# Patient Record
Sex: Female | Born: 1975 | Race: Black or African American | Hispanic: No | Smoking: Never smoker
Health system: Southern US, Community
[De-identification: ages and names within clinical notes are randomized; demographics above are authoritative.]

## PROBLEM LIST (undated history)

## (undated) DIAGNOSIS — F419 Anxiety disorder, unspecified: Secondary | ICD-10-CM

---

## 2002-01-25 ENCOUNTER — Emergency Department (HOSPITAL_COMMUNITY): Admission: EM | Admit: 2002-01-25 | Discharge: 2002-01-25 | Payer: Self-pay | Admitting: *Deleted

## 2002-01-25 ENCOUNTER — Encounter: Payer: Self-pay | Admitting: *Deleted

## 2003-10-13 ENCOUNTER — Inpatient Hospital Stay (HOSPITAL_COMMUNITY): Admission: AD | Admit: 2003-10-13 | Discharge: 2003-10-13 | Payer: Self-pay | Admitting: Obstetrics and Gynecology

## 2003-10-20 ENCOUNTER — Ambulatory Visit (HOSPITAL_COMMUNITY): Admission: RE | Admit: 2003-10-20 | Discharge: 2003-10-20 | Payer: Self-pay | Admitting: Obstetrics and Gynecology

## 2003-12-05 ENCOUNTER — Inpatient Hospital Stay (HOSPITAL_COMMUNITY): Admission: AD | Admit: 2003-12-05 | Discharge: 2003-12-07 | Payer: Self-pay | Admitting: Obstetrics and Gynecology

## 2004-07-03 ENCOUNTER — Other Ambulatory Visit: Admission: RE | Admit: 2004-07-03 | Discharge: 2004-07-03 | Payer: Self-pay | Admitting: Obstetrics and Gynecology

## 2005-09-09 ENCOUNTER — Other Ambulatory Visit: Admission: RE | Admit: 2005-09-09 | Discharge: 2005-09-09 | Payer: Self-pay | Admitting: Obstetrics and Gynecology

## 2006-09-05 ENCOUNTER — Other Ambulatory Visit: Admission: RE | Admit: 2006-09-05 | Discharge: 2006-09-05 | Payer: Self-pay | Admitting: Family Medicine

## 2008-06-24 ENCOUNTER — Other Ambulatory Visit: Admission: RE | Admit: 2008-06-24 | Discharge: 2008-06-24 | Payer: Self-pay | Admitting: Family Medicine

## 2008-07-05 ENCOUNTER — Encounter: Admission: RE | Admit: 2008-07-05 | Discharge: 2008-07-05 | Payer: Self-pay | Admitting: Family Medicine

## 2010-02-03 IMAGING — MG MM DIAGNOSTIC BILATERAL
5 series · 5 of 5 positions shown · non-contrast
Comparison: No prior studies.

CLINICAL DATA: Nodularity inferiorly within the right breast and
also to a lesser extent within the inferior portion of the left
breast.

DIGITAL DIAGNOSTIC  BILATERAL BASELINE  MAMMOGRAM  WITH CAD AND
BILATERAL BREAST ULTRASOUND:

[R CC]
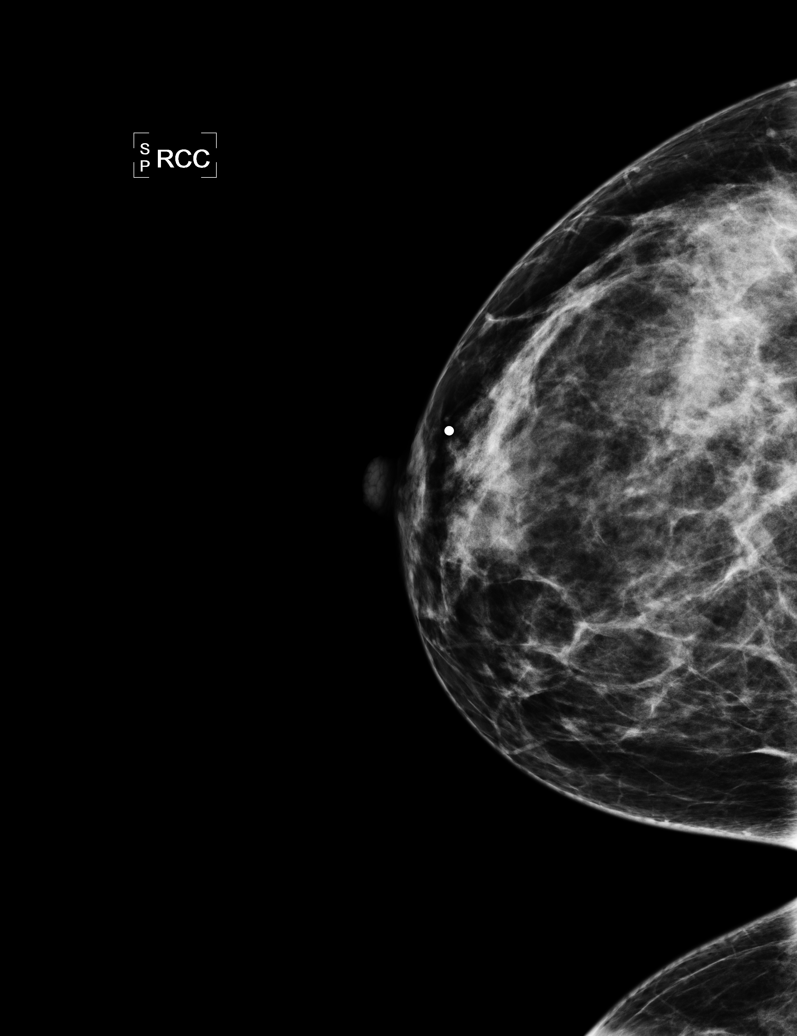

[L CC]
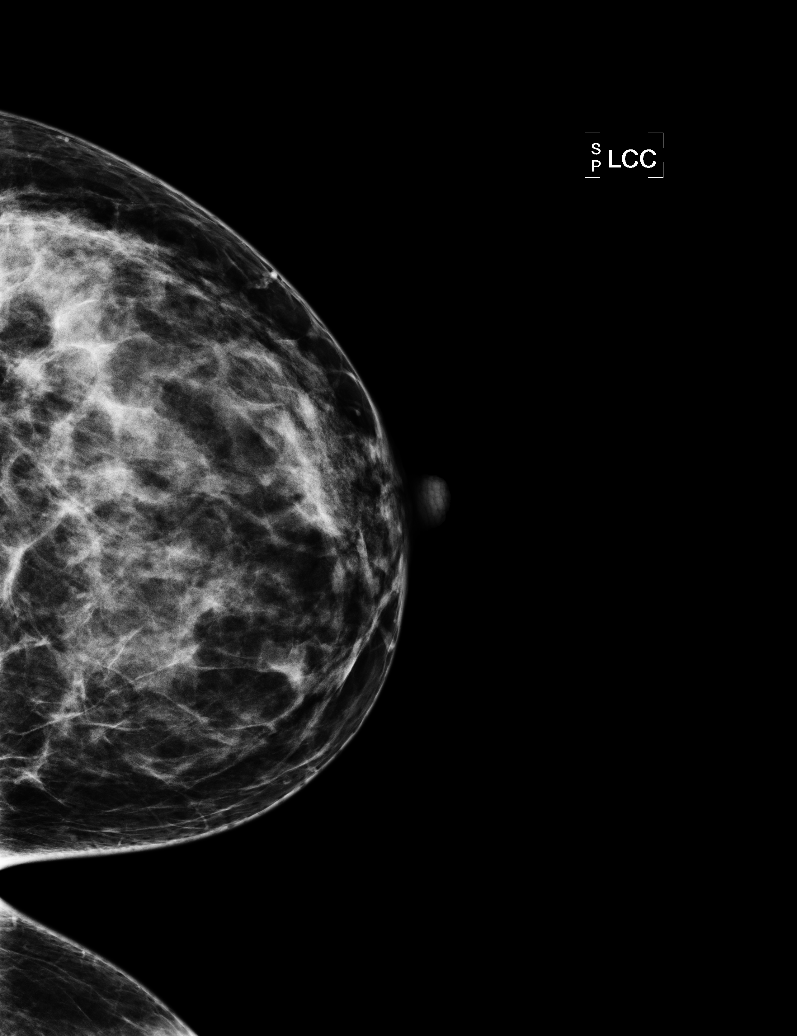

[L MLO]
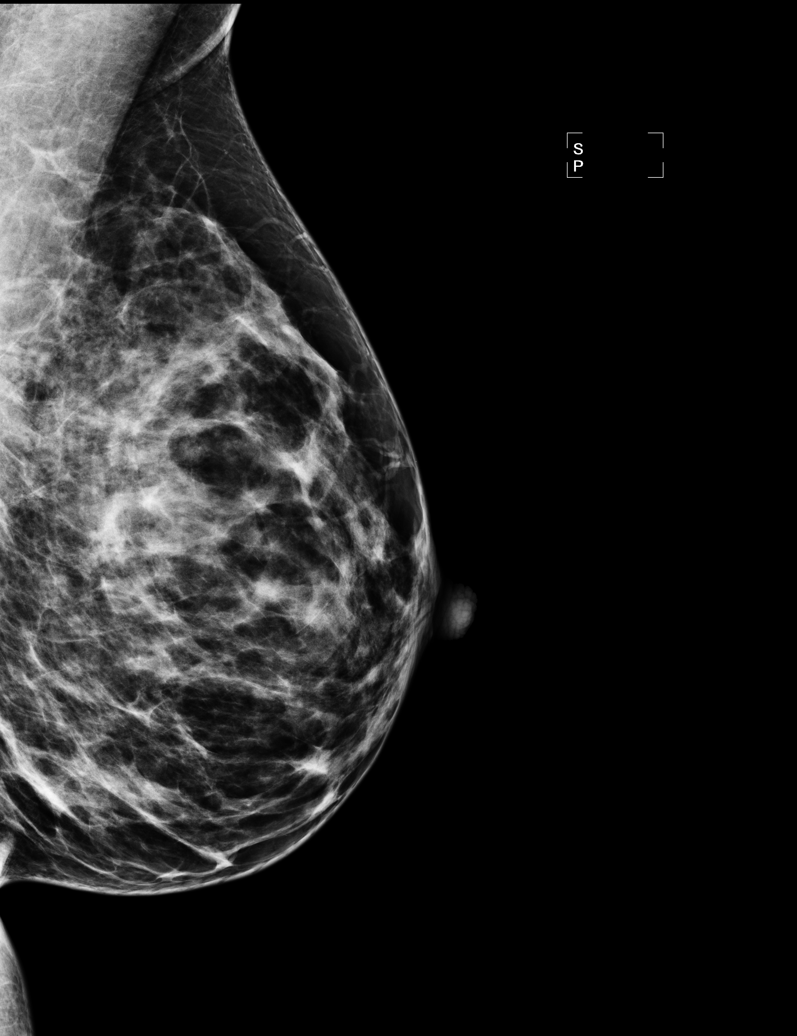

[R MLO]
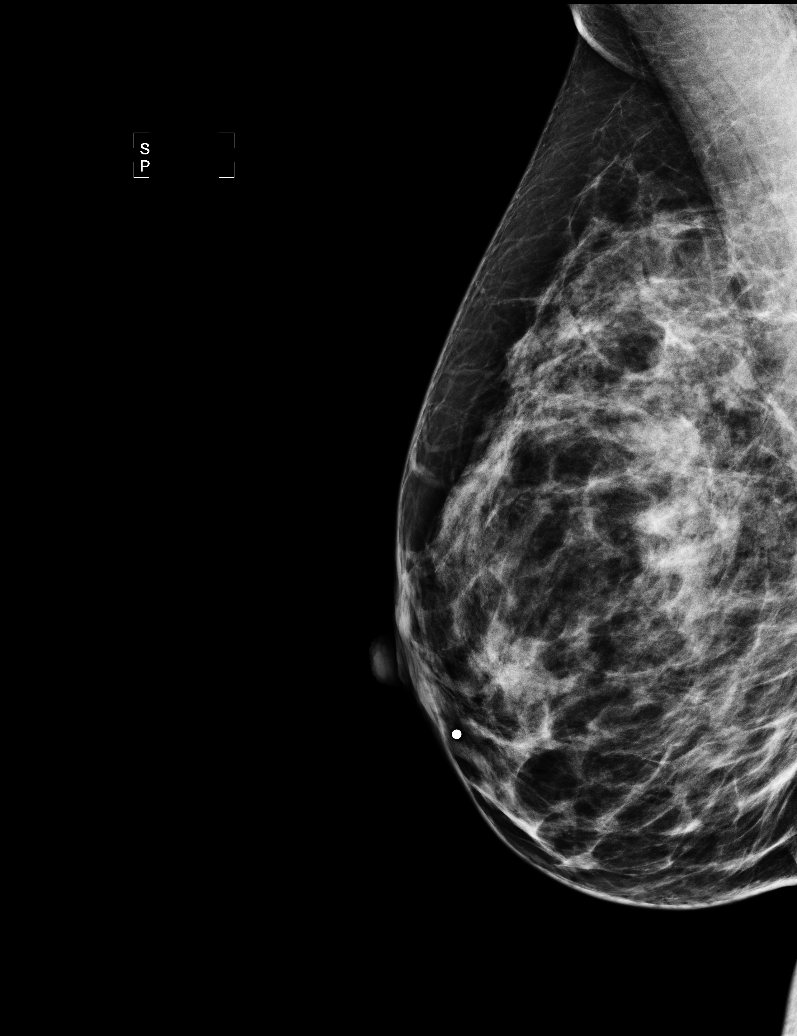

[R TAN]
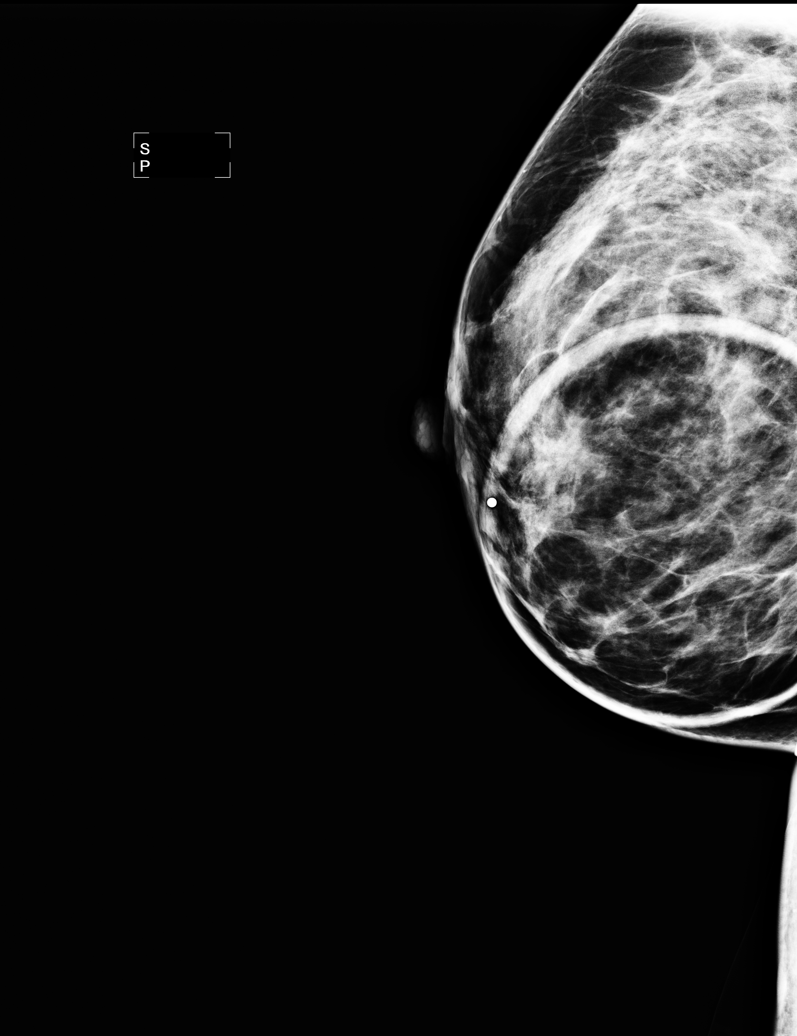

[5 of 5 positions shown; findings below may reference images not displayed]

FINDINGS: There is a moderately dense scattered fibroglandular
parenchymal pattern.  There is no evidence for mass.  There are no
areas of distortion or suspicious microcalcification.

On physical exam, there is mild nodularity within the inferior
portions of the breast without a discrete palpable mass present.

Ultrasound is performed, showing normal-appearing fibroglandular
tissue.  There is no mass, cyst, distortion, or worrisome
shadowing.
IMPRESSION: No findings worrisome for malignancy.  The importance of breast
self-examination was stressed with the patient.  Recommend annual
screening mammography at age 40 or earlier if felt to be clinically
indicated.

BI-RADS CATEGORY 1:  Negative.

## 2010-07-17 ENCOUNTER — Emergency Department (HOSPITAL_BASED_OUTPATIENT_CLINIC_OR_DEPARTMENT_OTHER): Admission: EM | Admit: 2010-07-17 | Discharge: 2010-07-17 | Payer: Self-pay | Admitting: Emergency Medicine

## 2010-07-25 ENCOUNTER — Other Ambulatory Visit
Admission: RE | Admit: 2010-07-25 | Discharge: 2010-07-25 | Payer: Self-pay | Source: Home / Self Care | Admitting: Family Medicine

## 2011-03-08 NOTE — H&P (Signed)
NAME:  CAMRY, ROBELLO                     ACCOUNT NO.:  [id number]   MEDICAL RECORD NO.:  [id number]                   PATIENT TYPE:  INP   LOCATION:  9164                                 FACILITY:  [facility]   PHYSICIAN:  Hal Morales, M.D.             DATE OF BIRTH:  11/15/1975   DATE OF ADMISSION:  12/05/2003  DATE OF DISCHARGE:                                HISTORY & PHYSICAL   HISTORY OF PRESENT ILLNESS:  Ms. Kearse is a 35 year old, gravida 4, para  2-0-1-2, at 36 and 2/7ths weeks, EDD November 26, 2003 by 15-week ultrasound,  and confirmed with follow up.  She presents with contractions throughout the  day, December 04, 2003, increasing in frequency and intensity.  She reports  positive fetal movement.  No bleeding, no range of motion.  No PIH symptoms.  No headache, visual changes, or epigastric pain.  Her pregnancy has been  followed by the M.D. service at [facility], and is remarkable for (1) late  prenatal care, (2) conceived on OCP's, (3) history of macrosomia, (4) kidney  stones and pyelonephritis at 24 weeks, (5) bilateral choroid plexus cysts,  resolved, (6) echogenic material in the amniotic fluid by ultrasound, (7)  group B strep negative.   OBSTETRICAL HISTORY:  In 1993, the patient had a normal spontaneous vaginal  delivery with the birth of a 9 pound, 12 ounce female infant named [place]  at [facility].  She was induced with no complications.  In the year  2000, she had a first trimester EAB with no complications.  In 2001, the  patient had a normal spontaneous vaginal delivery at 40 weeks, with the  birth of a 7 pounds, 11 ounce female infant named Pura Spice at [facility] with no complications, and the present pregnancy.   ALLERGIES:  The patient has no known drug allergies.   CURRENT MEDICATIONS:  She is not currently taking any medications, except  prenatal vitamins, and denies the use of tobacco, alcohol, or illicit drugs.   MEDICAL HISTORY:   She had an abnormal Pap smear, which was reevaluated and  found to be normal.  Otherwise, her medical history is unremarkable.   FAMILY HISTORY:  The patient's mother deceased of myocardial infarction at  age 91.  Paternal aunt with diabetes.  The patient's sister with a history  of thyroid disease.  Maternal first cousin with chronic renal disease.  The  patient's father with a brain tumor, deceased.  The patient's brother with  schizophrenia, and mother with depression.   GENETIC HISTORY:  The patient's first cousin with a history of sickle cell  trait, and father of the baby's niece with mental retardation.   SOCIAL HISTORY:  Ms. Cena is a 35 year old married African-American  female.  Her husband, Caleesi Kohl, is involved and supportive.  They  are [place] in their faith.   REVIEW OF SYSTEMS:  As described above.  The patient is typical of  one with  a uterine pregnancy at term.   PRENATAL HISTORY:  This patient entered prenatal care on June 09, 2003 at  15 weeks and 5 days by ultrasound.  This patient's prenatal course was  complicated by fetal exam by ultrasound showing bilateral choroid plexus  cysts.  These persisted to 24 weeks.  The patient was counseled and  scheduled for genetic counseling and ultrasound and possible amniocentesis  with a perinatologist at [facility].  Bilateral choroid cysts were on  ultrasound examination, and the patient declined amniocentesis.  On August 01, 2003, the patient was diagnosed with kidney stones and pyelonephritis.  She was hospitalized at [facility] with Dr. Delton See as her  urologist.  The patient was seen for evaluation and treatment for hip pain  at [facility].  One hour glucose challenge at 18 weeks and at 28 weeks within  normal limits.  Blood type O positive.  At 33 weeks, estimated fetal weight  50th to 75th percentile, and echogenic material noted in the amniotic fluid.  Negative KLB, and normal CBC.   The patient was followed from that point with  biweekly NST's, AFI, BPP, and cervical evaluations.  These have been within  normal limits.  At 36 weeks, the infant was noted to remain in breech  presentation.  External cephalic version was planned, but the baby  spontaneously verted to vertex presentation.  Fasting blood sugar at 38  weeks within normal limits.  The patient has been size greater than dates  throughout her pregnancy.  Estimated fetal weight remained at the 50th to  75th percentile.   PRENATAL LABORATORY WORK:  On July 01, 2003, hemoglobin and hematocrit  11.1 and 33.9, platelets 106,000.  Blood type and Rh O positive, antibody  screen negative, sickle cell trait negative, VDRL nonreactive, rubella  immune, hepatitis B surface antigen negative, HIV nonreactive.  Quad screen  within normal limits.  One hour glucose challenge at 18 and 28 weeks within  normal limits at 36 weeks.  Culture of the vaginal tract is negative for  group B strep, GC, and Chlamydia.   The patient's antenatal testing has all been within normal limits, and she  has been scheduled for induction of labor on December 07, 2003; however, in  light of nonreactive fetal heart rate and post-dates, the patient is  admitted at the present time, with plan to be discussed with the patient by  Dr. Dierdre Forth.   ASSESSMENT:  1. Intrauterine pregnancy at 55 and 2/7ths weeks.  2. Post-dates nonreactive fetal heart rate.   PLAN:  1. Admit per Dr. Dierdre Forth.  2. Routine M.D. orders.     Rica Koyanagi, C.N.M.               Hal Morales, M.D.    SDM/MEDQ  D:  12/05/2003  T:  12/05/2003  Job:  [id number]

## 2017-03-20 ENCOUNTER — Ambulatory Visit: Payer: Self-pay | Admitting: Physician Assistant

## 2018-01-21 ENCOUNTER — Encounter: Payer: Self-pay | Admitting: Physician Assistant

## 2019-05-13 ENCOUNTER — Encounter (HOSPITAL_BASED_OUTPATIENT_CLINIC_OR_DEPARTMENT_OTHER): Payer: Self-pay | Admitting: *Deleted

## 2019-05-13 ENCOUNTER — Other Ambulatory Visit: Payer: Self-pay

## 2019-05-13 ENCOUNTER — Emergency Department (HOSPITAL_BASED_OUTPATIENT_CLINIC_OR_DEPARTMENT_OTHER)
Admission: EM | Admit: 2019-05-13 | Discharge: 2019-05-14 | Disposition: A | Discharge status: Home / Self Care | Payer: BLUE CROSS/BLUE SHIELD | Attending: Family Medicine | Admitting: Family Medicine

## 2019-05-13 DIAGNOSIS — E876 Hypokalemia: Secondary | ICD-10-CM | POA: Diagnosis not present

## 2019-05-13 DIAGNOSIS — R000 Tachycardia, unspecified: Secondary | ICD-10-CM

## 2019-05-13 DIAGNOSIS — T887XXA Unspecified adverse effect of drug or medicament, initial encounter: Secondary | ICD-10-CM | POA: Diagnosis not present

## 2019-05-13 DIAGNOSIS — T50905A Adverse effect of unspecified drugs, medicaments and biological substances, initial encounter: Secondary | ICD-10-CM | POA: Diagnosis not present

## 2019-05-13 DIAGNOSIS — R002 Palpitations: Secondary | ICD-10-CM | POA: Diagnosis present

## 2019-05-13 DIAGNOSIS — Y69 Unspecified misadventure during surgical and medical care: Secondary | ICD-10-CM | POA: Diagnosis not present

## 2019-05-13 HISTORY — DX: Anxiety disorder, unspecified: F41.9

## 2019-05-13 LAB — CBC WITH DIFFERENTIAL/PLATELET
Abs Immature Granulocytes: 0.01 10*3/uL (ref 0.00–0.07)
Basophils Absolute: 0.1 10*3/uL (ref 0.0–0.1)
Basophils Relative: 1 %
Eosinophils Absolute: 0.1 10*3/uL (ref 0.0–0.5)
Eosinophils Relative: 1 %
HCT: 42.7 % (ref 36.0–46.0)
Hemoglobin: 13.6 g/dL (ref 12.0–15.0)
Immature Granulocytes: 0 %
Lymphocytes Relative: 57 %
Lymphs Abs: 4.7 10*3/uL — ABNORMAL HIGH (ref 0.7–4.0)
MCH: 28.7 pg (ref 26.0–34.0)
MCHC: 31.9 g/dL (ref 30.0–36.0)
MCV: 90.1 fL (ref 80.0–100.0)
Monocytes Absolute: 1 10*3/uL (ref 0.1–1.0)
Monocytes Relative: 12 %
Neutro Abs: 2.3 10*3/uL (ref 1.7–7.7)
Neutrophils Relative %: 29 %
Platelets: 258 10*3/uL (ref 150–400)
RBC: 4.74 MIL/uL (ref 3.87–5.11)
RDW: 13.6 % (ref 11.5–15.5)
WBC: 8.1 10*3/uL (ref 4.0–10.5)
nRBC: 0 % (ref 0.0–0.2)

## 2019-05-13 LAB — COMPREHENSIVE METABOLIC PANEL
ALT: 27 U/L (ref 0–44)
AST: 27 U/L (ref 15–41)
Albumin: 3.5 g/dL (ref 3.5–5.0)
Alkaline Phosphatase: 77 U/L (ref 38–126)
Anion gap: 13 (ref 5–15)
BUN: 14 mg/dL (ref 6–20)
CO2: 18 mmol/L — ABNORMAL LOW (ref 22–32)
Calcium: 8.8 mg/dL — ABNORMAL LOW (ref 8.9–10.3)
Chloride: 105 mmol/L (ref 98–111)
Creatinine, Ser: 0.81 mg/dL (ref 0.44–1.00)
GFR calc Af Amer: >60 mL/min (ref >60)
GFR calc non Af Amer: >60 mL/min (ref >60)
Glucose, Bld: 144 mg/dL — ABNORMAL HIGH (ref 70–99)
Potassium: 2.7 mmol/L — CL (ref 3.5–5.1)
Sodium: 136 mmol/L (ref 135–145)
Total Bilirubin: 0.3 mg/dL (ref 0.3–1.2)
Total Protein: 7.2 g/dL (ref 6.5–8.1)

## 2019-05-13 LAB — MAGNESIUM: Magnesium: 2 mg/dL (ref 1.7–2.4)

## 2019-05-13 LAB — LIPASE, BLOOD: Lipase: 35 U/L (ref 11–51)

## 2019-05-13 MED ORDER — ADENOSINE 6 MG/2ML IV SOLN
18.0000 mg | Freq: Once | INTRAVENOUS | Status: AC
  Administered 2019-05-13: 18 mg via INTRAVENOUS

## 2019-05-13 MED ORDER — POTASSIUM CHLORIDE 10 MEQ/100ML IV SOLN
10.0000 meq | INTRAVENOUS | Status: AC
  Administered 2019-05-13 – 2019-05-14 (×4): 10 meq via INTRAVENOUS
  Filled 2019-05-13 (×4): qty 100

## 2019-05-13 MED ORDER — ADENOSINE 6 MG/2ML IV SOLN
INTRAVENOUS | Status: AC
  Filled 2019-05-13: qty 6

## 2019-05-13 MED ORDER — ADENOSINE 6 MG/2ML IV SOLN
12.0000 mg | Freq: Once | INTRAVENOUS | Status: AC
  Administered 2019-05-13: 12 mg via INTRAVENOUS

## 2019-05-13 MED ORDER — ADENOSINE 6 MG/2ML IV SOLN
6.0000 mg | Freq: Once | INTRAVENOUS | Status: AC
  Administered 2019-05-13: 22:00:00 6 mg via INTRAVENOUS

## 2019-05-13 MED ORDER — SODIUM CHLORIDE 0.9 % IV BOLUS
500.0000 mL | Freq: Once | INTRAVENOUS | Status: AC
  Administered 2019-05-13: 500 mL via INTRAVENOUS

## 2019-05-13 MED ORDER — SODIUM CHLORIDE 0.9 % IV BOLUS
1000.0000 mL | Freq: Once | INTRAVENOUS | Status: AC
  Administered 2019-05-13: 1000 mL via INTRAVENOUS

## 2019-05-13 MED ORDER — LORAZEPAM 2 MG/ML IJ SOLN
1.0000 mg | Freq: Once | INTRAMUSCULAR | Status: AC
Start: 2019-05-13 — End: 2019-05-13
  Administered 2019-05-13: 22:00:00 1 mg via INTRAVENOUS
  Filled 2019-05-13: qty 1

## 2019-05-13 NOTE — ED Triage Notes (Signed)
States she took some CBD chewables 40 minutes ago and now her heart is racing. States she takes Wellbutrin.

## 2019-05-13 NOTE — ED Notes (Signed)
Date and time results received: 05/13/19 2216 (use smartphrase ".now" to insert current time)  Test: K+ Critical Value: 2.7  Name of Provider Notified:Long

## 2019-05-13 NOTE — ED Notes (Signed)
Pt on monitor 

## 2019-05-13 NOTE — ED Provider Notes (Signed)
Emergency Department Provider Note   I have reviewed the triage vital signs and the nursing notes.   HISTORY  Chief Complaint Palpitations and Ingestion CBD product   HPI Taylor Houston is a 43 y.o. female with past medical history of anxiety presents to the emergency department for evaluation of heart palpitations.  She states that she took some CBD chewables and shortly afterwards developed the palpitations.  She denies any chest pain or shortness of breath.  No fevers or chills.  No similar symptoms in the past.  She denies history of thyroid issues.  No lightheadedness or near syncope.   Past Medical History:  Diagnosis Date  . Anxiety     There are no active problems to display for this patient.   History reviewed. No pertinent surgical history.  Allergies Patient has no known allergies.  No family history on file.  Social History Social History   Tobacco Use  . Smoking status: Never Smoker  . Smokeless tobacco: Never Used  Substance Use Topics  . Alcohol use: Yes  . Drug use: Never    Review of Systems  Constitutional: No fever/chills Eyes: No visual changes. ENT: No sore throat. Cardiovascular: Denies chest pain. Positive heart palpitations.  Respiratory: Denies shortness of breath. Gastrointestinal: No abdominal pain.  No nausea, no vomiting.  No diarrhea.  No constipation. Genitourinary: Negative for dysuria. Musculoskeletal: Negative for back pain. Skin: Negative for rash. Neurological: Negative for headaches, focal weakness or numbness.  10-point ROS otherwise negative.  ____________________________________________   PHYSICAL EXAM:  VITAL SIGNS: ED Triage Vitals  Enc Vitals Group     BP 05/13/19 2121 (!) 178/115     Pulse Rate 05/13/19 2121 (!) 168     Resp 05/13/19 2121 20     Temp 05/13/19 2121 98.6 F (37 C)     Temp Source 05/13/19 2121 Oral     SpO2 05/13/19 2121 100 %     Weight 05/13/19 2119 184 lb (83.5 kg)     Height  05/13/19 2119 5\' 7"  (1.702 m)   Constitutional: Alert and oriented. Well appearing and in no acute distress. Eyes: Conjunctivae are normal.  Head: Atraumatic. Nose: No congestion/rhinnorhea. Mouth/Throat: Mucous membranes are moist.  Oropharynx non-erythematous. Neck: No stridor.   Cardiovascular: Tachycardia. Good peripheral circulation. Grossly normal heart sounds.   Respiratory: Normal respiratory effort.  No retractions. Lungs CTAB. Gastrointestinal: Soft and nontender. No distention.  Musculoskeletal: No lower extremity tenderness nor edema. No gross deformities of extremities. Neurologic:  Normal speech and language. No gross focal neurologic deficits are appreciated.  Skin:  Skin is warm, dry and intact. No rash noted.  ____________________________________________   LABS (all labs ordered are listed, but only abnormal results are displayed)  Labs Reviewed  COMPREHENSIVE METABOLIC PANEL - Abnormal; Notable for the following components:      Result Value   Potassium 2.7 (*)    CO2 18 (*)    Glucose, Bld 144 (*)    Calcium 8.8 (*)    All other components within normal limits  CBC WITH DIFFERENTIAL/PLATELET - Abnormal; Notable for the following components:   Lymphs Abs 4.7 (*)    All other components within normal limits  LIPASE, BLOOD  MAGNESIUM  D-DIMER, QUANTITATIVE (NOT AT [facility])   ____________________________________________  EKG   EKG Interpretation  Date/Time:  Thursday May 13 2019 21:49:37 EDT Ventricular Rate:  136 PR Interval:    QRS Duration: 82 QT Interval:  285 QTC Calculation: 429 R Axis:  71 Text Interpretation:  Sinus tachycardia Probable LVH with secondary repol abnrm ST depr, consider ischemia, inferior leads No STEMI  Confirmed by Alona BeneLong, Joshua [phone]([id number]) on 05/13/2019 10:26:28 PM Also confirmed by Alona BeneLong, Joshua [phone]), editor Barbette Hairassel, Kerry [phone])  on 05/14/2019 7:17:46 AM       ____________________________________________  RADIOLOGY  None  ____________________________________________   PROCEDURES  Procedure(s) performed:   Procedures  CRITICAL CARE Performed by: Maia PlanJoshua G Long Total critical care time: 35 minutes Critical care time was exclusive of separately billable procedures and treating other patients. Critical care was necessary to treat or prevent imminent or life-threatening deterioration. Critical care was time spent personally by me on the following activities: development of treatment plan with patient and/or surrogate as well as nursing, discussions with consultants, evaluation of patient's response to treatment, examination of patient, obtaining history from patient or surrogate, ordering and performing treatments and interventions, ordering and review of laboratory studies, ordering and review of radiographic studies, pulse oximetry and re-evaluation of patient's condition.  Alona BeneJoshua Long, MD Emergency Medicine  ____________________________________________   INITIAL IMPRESSION / ASSESSMENT AND PLAN / ED COURSE  Pertinent labs & imaging results that were available during my care of the patient were reviewed by me and considered in my medical decision making (see chart for details).   Patient presents to the emergency dept for evaluation of heart palpitations.  Symptoms began abruptly.  On arrival her EKG is most consistent with SVT versus one-to-one flutter.  Patient is hypertensive with normal oxygen sat.  She received 3 rounds of escalating adenosine with no change in rhythm although did slow somewhat.  No pause was identified on monitor.  Repeat EKG shows  sinus tachycardia now slowed to a rate in the 130s.  Labs show hypokalemia.  Which will be replaced.  Giving IV fluids, Ativan and following d-dimer.  Labs show hypokalemia. D-dimer pending. Patient given IVF, ativan, and potassium in the ED. HR continuing to normalize. Patient care transferred to Dr. Read DriversMolpus pending potassium completion, d-dimer, and  reassess. Suspect SVT related to drug ingestion.  ____________________________________________  FINAL CLINICAL IMPRESSION(S) / ED DIAGNOSES  Final diagnoses:  Adverse drug effect, initial encounter  Sinus tachycardia  Hypokalemia     MEDICATIONS GIVEN DURING THIS VISIT:  Medications  adenosine (ADENOCARD) 6 MG/2ML injection 6 mg (6 mg Intravenous Given 05/13/19 2139)  sodium chloride 0.9 % bolus 500 mL (0 mLs Intravenous Stopped 05/13/19 2158)  adenosine (ADENOCARD) 6 MG/2ML injection 12 mg (12 mg Intravenous Given 05/13/19 2142)  adenosine (ADENOCARD) 6 MG/2ML injection 18 mg (18 mg Intravenous Given 05/13/19 2146)  sodium chloride 0.9 % bolus 1,000 mL (0 mLs Intravenous Stopped 05/13/19 2306)  LORazepam (ATIVAN) injection 1 mg (1 mg Intravenous Given 05/13/19 2203)  potassium chloride 10 mEq in 100 mL IVPB (0 mEq Intravenous Stopped 05/14/19 0247)  LORazepam (ATIVAN) injection 1 mg (1 mg Intravenous Given 05/14/19 0239)    Note:  This document was prepared using Dragon voice recognition software and may include unintentional dictation errors.  Alona BeneJoshua Long, MD Emergency Medicine    Long, Arlyss RepressJoshua G, MD 05/14/19 [phone]

## 2019-05-14 LAB — D-DIMER, QUANTITATIVE (NOT AT ARMC): D-Dimer, Quant: 0.32 ug/mL-FEU (ref 0.00–0.50)

## 2019-05-14 MED ORDER — LORAZEPAM 2 MG/ML IJ SOLN
1.0000 mg | Freq: Once | INTRAMUSCULAR | Status: AC
  Administered 2019-05-14: 1 mg via INTRAVENOUS
  Filled 2019-05-14: qty 1

## 2019-05-14 MED ORDER — POTASSIUM CHLORIDE CRYS ER 20 MEQ PO TBCR: 20.0000 meq | EXTENDED_RELEASE_TABLET | Freq: Two times a day (BID) | ORAL | 0 refills | Status: AC

## 2019-05-14 NOTE — ED Provider Notes (Signed)
Nursing notes and vitals signs, including pulse oximetry, reviewed.  Summary of this visit's results, reviewed by myself:  EKG:  EKG Interpretation  Date/Time:  Thursday May 13 2019 21:49:37 EDT Ventricular Rate:  136 PR Interval:    QRS Duration: 82 QT Interval:  285 QTC Calculation: 429 R Axis:   71 Text Interpretation:  Sinus tachycardia Probable LVH with secondary repol abnrm ST depr, consider ischemia, inferior leads No STEMI  Confirmed by Alona BeneLong, Joshua [phone]) on 05/13/2019 10:26:28 PM       Labs:  Results for orders placed or performed during the hospital encounter of 05/13/19 (from the past 24 hour(s))  Comprehensive metabolic panel     Status: Abnormal   Collection Time: 05/13/19  9:37 PM  Result Value Ref Range   Sodium 136 135 - 145 mmol/L   Potassium 2.7 (LL) 3.5 - 5.1 mmol/L   Chloride 105 98 - 111 mmol/L   CO2 18 (L) 22 - 32 mmol/L   Glucose, Bld 144 (H) 70 - 99 mg/dL   BUN 14 6 - 20 mg/dL   Creatinine, Ser 6.040.81 0.44 - 1.00 mg/dL   Calcium 8.8 (L) 8.9 - 10.3 mg/dL   Total Protein 7.2 6.5 - 8.1 g/dL   Albumin 3.5 3.5 - 5.0 g/dL   AST 27 15 - 41 U/L   ALT 27 0 - 44 U/L   Alkaline Phosphatase 77 38 - 126 U/L   Total Bilirubin 0.3 0.3 - 1.2 mg/dL   GFR calc non Af Amer >60 >60 mL/min   GFR calc Af Amer >60 >60 mL/min   Anion gap 13 5 - 15  Lipase, blood     Status: None   Collection Time: 05/13/19  9:37 PM  Result Value Ref Range   Lipase 35 11 - 51 U/L  CBC with Differential     Status: Abnormal   Collection Time: 05/13/19  9:37 PM  Result Value Ref Range   WBC 8.1 4.0 - 10.5 K/uL   RBC 4.74 3.87 - 5.11 MIL/uL   Hemoglobin 13.6 12.0 - 15.0 g/dL   HCT 54.042.7 98.136.0 - 19.146.0 %   MCV 90.1 80.0 - 100.0 fL   MCH 28.7 26.0 - 34.0 pg   MCHC 31.9 30.0 - 36.0 g/dL   RDW 47.813.6 29.511.5 - 62.115.5 %   Platelets 258 150 - 400 K/uL   nRBC 0.0 0.0 - 0.2 %   Neutrophils Relative % 29 %   Neutro Abs 2.3 1.7 - 7.7 K/uL   Lymphocytes Relative 57 %   Lymphs Abs 4.7 (H) 0.7 - 4.0  K/uL   Monocytes Relative 12 %   Monocytes Absolute 1.0 0.1 - 1.0 K/uL   Eosinophils Relative 1 %   Eosinophils Absolute 0.1 0.0 - 0.5 K/uL   Basophils Relative 1 %   Basophils Absolute 0.1 0.0 - 0.1 K/uL   Immature Granulocytes 0 %   Abs Immature Granulocytes 0.01 0.00 - 0.07 K/uL  Magnesium     Status: None   Collection Time: 05/13/19  9:37 PM  Result Value Ref Range   Magnesium 2.0 1.7 - 2.4 mg/dL  D-dimer, quantitative     Status: None   Collection Time: 05/13/19  9:38 PM  Result Value Ref Range   D-Dimer, Quant 0.32 0.00 - 0.50 ug/mL-FEU   3:28 AM Patient given 4 rounds of potassium chloride in ED.  We will discharge her on oral potassium supplementation and have her follow-up with her primary care physician.  The cause of her persistent sinus tachycardia is likely due to adverse effect of the drug she consumed.  CBD would not be expected to do this but it is unclear if there were coingestants or contamination.     Zuma Hust, Jenny Reichmann, MD 05/14/19 [phone]
